# Patient Record
Sex: Male | Born: 1990 | Hispanic: Yes | Marital: Single | Smoking: Current some day smoker
Health system: Southern US, Community
[De-identification: ages and names within clinical notes are randomized; demographics above are authoritative.]

---
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
## 2011-06-29 ENCOUNTER — Emergency Department: Payer: Self-pay | Admitting: Emergency Medicine

## 2011-07-01 ENCOUNTER — Emergency Department: Payer: Self-pay | Admitting: Emergency Medicine

## 2017-09-03 ENCOUNTER — Ambulatory Visit
Admission: EM | Admit: 2017-09-03 | Discharge: 2017-09-03 | Disposition: A | Discharge status: Home / Self Care | Payer: 59 | Attending: Family Medicine | Admitting: Family Medicine

## 2017-09-03 ENCOUNTER — Encounter: Payer: Self-pay | Admitting: Emergency Medicine

## 2017-09-03 DIAGNOSIS — M7052 Other bursitis of knee, left knee: Secondary | ICD-10-CM | POA: Diagnosis not present

## 2017-09-03 MED ORDER — MELOXICAM 15 MG PO TABS: 15.0000 mg | ORAL_TABLET | Freq: Every day | ORAL | 0 refills | Status: DC | PRN

## 2017-09-03 NOTE — Discharge Instructions (Signed)
Rest, Ice, elevation.  Medication as directed.  Take care  Dr. Adriana Simasook

## 2017-09-03 NOTE — ED Provider Notes (Signed)
[facility]    CSN: [id number] Arrival date & time: 09/03/17  1049  History   Chief Complaint Chief Complaint  Patient presents with  . Knee Pain   HPI  27 year old male presents with left knee pain.  Patient reports that his left knee pain started on Wednesday.  He states that he works in [employer] and was recently under a house on his hands and knee.  Subsequently developed left anterior knee pain.  Pain is located below the patella.  Worse with pressure and activity.  No relieving factors.  Mild surrounding redness.  No medications or interventions tried.  Seems to be worsening.  No other associated symptoms.  No other complaints.  Social History Social History   Tobacco Use  . Smoking status: Current Some Day Smoker  . Smokeless tobacco: Never Used  Substance Use Topics  . Alcohol use: Yes    Comment: occassional   . Drug use: Not on file     Allergies   Patient has no known allergies.   Review of Systems Review of Systems  Constitutional: Negative.   Musculoskeletal:       Left knee pain, swelling.   Physical Exam Triage Vital Signs ED Triage Vitals  Enc Vitals Group     BP 09/03/17 1106 127/79     Pulse Rate 09/03/17 1106 (!) 51     Resp 09/03/17 1106 16     Temp 09/03/17 1106 98.3 F (36.8 C)     Temp src --      SpO2 09/03/17 1106 100 %     Weight 09/03/17 1107 170 lb (77.1 kg)     Height 09/03/17 1107 5\' 6"  (1.676 m)     Head Circumference --      Peak Flow --      Pain Score 09/03/17 1107 5     Pain Loc --      Pain Edu? --      Excl. in GC? --    Updated Vital Signs BP 127/79 (BP Location: Right Arm)   Pulse (!) 51   Temp 98.3 F (36.8 C)   Resp 16   Ht 5\' 6"  (1.676 m)   Wt 170 lb (77.1 kg)   SpO2 100%   BMI 27.44 kg/m   Visual Acuity Right Eye Distance:   Left Eye Distance:   Bilateral Distance:    Right Eye Near:   Left Eye Near:    Bilateral Near:     Physical Exam  Constitutional: He is oriented to person, place,  and time. He appears well-developed. No distress.  HENT:  Head: Normocephalic and atraumatic.  Pulmonary/Chest: Effort normal. No respiratory distress.  Musculoskeletal:  Left knee -patient with swelling in the infrapatellar region.  Mild erythema.  Slight warmth.  Tender anteriorly.  Ligaments intact.  Neurological: He is alert and oriented to person, place, and time.  Psychiatric: He has a normal mood and affect. His behavior is normal.  Nursing note and vitals reviewed.  UC Treatments / Results  Labs (all labs ordered are listed, but only abnormal results are displayed) Labs Reviewed - No data to display  EKG None  Radiology No results found.  Procedures Procedures (including critical care time)  Medications Ordered in UC Medications - No data to display  Initial Impression / Assessment and Plan / UC Course  I have reviewed the triage vital signs and the nursing notes.  Pertinent labs & imaging results that were available during my care of  the patient were reviewed by me and considered in my medical decision making (see chart for details).    27 year old male presents with infrapatellar bursitis.  Rest, ice, elevation.  Meloxicam as directed.  Final Clinical Impressions(s) / UC Diagnoses   Final diagnoses:  Infrapatellar bursitis of left knee     Discharge Instructions     Rest, Ice, elevation.  Medication as directed.  Take care  Dr. Adriana Simas   ED Prescriptions    Medication Sig Dispense Auth. Provider   meloxicam (MOBIC) 15 MG tablet Take 1 tablet (15 mg total) by mouth daily as needed. 30 tablet Tommie Sams, DO     Controlled Substance Prescriptions [place] Controlled Substance Registry consulted? Not Applicable   Tommie Sams, DO 09/03/17 1212

## 2017-09-03 NOTE — ED Triage Notes (Signed)
Pt reports left knee pain that started about 2 days ago after working requiring pt to crawl. Pt reports started as redness and swelling and reports has gotten larger

## 2017-09-14 ENCOUNTER — Emergency Department: Payer: 59

## 2017-09-14 ENCOUNTER — Other Ambulatory Visit: Payer: Self-pay

## 2017-09-14 ENCOUNTER — Emergency Department
Admission: EM | Admit: 2017-09-14 | Discharge: 2017-09-14 | Disposition: A | Discharge status: Home / Self Care | Payer: 59 | Attending: Emergency Medicine | Admitting: Emergency Medicine

## 2017-09-14 ENCOUNTER — Encounter: Payer: Self-pay | Admitting: Emergency Medicine

## 2017-09-14 DIAGNOSIS — F17210 Nicotine dependence, cigarettes, uncomplicated: Secondary | ICD-10-CM | POA: Diagnosis not present

## 2017-09-14 DIAGNOSIS — M7051 Other bursitis of knee, right knee: Secondary | ICD-10-CM | POA: Diagnosis not present

## 2017-09-14 DIAGNOSIS — M25561 Pain in right knee: Secondary | ICD-10-CM | POA: Diagnosis present

## 2017-09-14 DIAGNOSIS — Y939 Activity, unspecified: Secondary | ICD-10-CM | POA: Diagnosis not present

## 2017-09-14 MED ORDER — HYDROCODONE-ACETAMINOPHEN 5-325 MG PO TABS: 1.0000 | ORAL_TABLET | Freq: Four times a day (QID) | ORAL | 0 refills | Status: DC | PRN

## 2017-09-14 MED ORDER — PREDNISONE 10 MG PO TABS: ORAL_TABLET | ORAL | 0 refills | Status: DC

## 2017-09-14 NOTE — ED Triage Notes (Signed)
Patient complaining of right knee pain starting last PM.  Denies injury.  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  Redness and swelling noted right knee.

## 2017-09-14 NOTE — ED Provider Notes (Signed)
[facility] Emergency Department Provider Note  ____________________________________________   First MD Initiated Contact with Patient 09/14/17 1049     (approximate)  I have reviewed the triage vital signs and the nursing notes.   HISTORY  Chief Complaint Knee Pain   HPI Patrick Hogan is a 27 y.o. male is here with complaint of right knee pain that started last evening.  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  He states his right knee became red and swollen.  He denies any fever or chills.  There is been no nausea or vomiting.  He denies any previous injury to his right knee.  He states that something similar happened to his left knee and he was seen by his PCP.  He was prescribed meloxicam at that time but never got the prescription filled and his knee got better.  Currently rates his pain as an 8 out of 10.   History reviewed. No pertinent past medical history.  There are no active problems to display for this patient.   History reviewed. No pertinent surgical history.  Prior to Admission medications   Medication Sig Start Date End Date Taking? Authorizing Provider  HYDROcodone-acetaminophen (NORCO/VICODIN) 5-325 MG tablet Take 1 tablet by mouth every 6 (six) hours as needed for moderate pain. 09/14/17   Tommi RumpsSummers, Jaz Laningham L, PA-C  predniSONE (DELTASONE) 10 MG tablet Take 6 tablets  today, on day 2 take 5 tablets, day 3 take 4 tablets, day 4 take 3 tablets, day 5 take  2 tablets and 1 tablet the last day 09/14/17   Tommi RumpsSummers, Phillippe Orlick L, PA-C    Allergies Patient has no known allergies.  No family history on file.  Social History Social History   Tobacco Use  . Smoking status: Current Some Day Smoker  . Smokeless tobacco: Never Used  Substance Use Topics  . Alcohol use: Yes    Comment: occassional   . Drug use: Not on file    Review of Systems Constitutional: No fever/chills Cardiovascular:  Denies chest pain. Respiratory: Denies shortness of breath. Gastrointestinal:  No nausea, no vomiting. Musculoskeletal: Positive for right knee pain. Skin: Positive for erythema right knee. Neurological: Negative for headaches, focal weakness or numbness. ___________________________________________   PHYSICAL EXAM:  VITAL SIGNS: ED Triage Vitals  Enc Vitals Group     BP 09/14/17 1046 127/73     Pulse Rate 09/14/17 1046 64     Resp 09/14/17 1046 16     Temp 09/14/17 1046 98.4 F (36.9 C)     Temp Source 09/14/17 1046 Oral     SpO2 09/14/17 1046 100 %     Weight 09/14/17 1047 170 lb (77.1 kg)     Height 09/14/17 1047 5\' 7"  (1.702 m)     Head Circumference --      Peak Flow --      Pain Score 09/14/17 1046 8     Pain Loc --      Pain Edu? --      Excl. in GC? --    Constitutional: Alert and oriented. Well appearing and in no acute distress. Eyes: Conjunctivae are normal.  Head: Atraumatic. Neck: No stridor.   Cardiovascular: Normal rate, regular rhythm. Grossly normal heart sounds.  Good peripheral circulation. Respiratory: Normal respiratory effort.  No retractions. Lungs CTAB. Musculoskeletal: Examination of the right knee there is moderate soft tissue swelling at the patella without effusion.  Range of motion is  restricted secondary to pain.  There is marked tenderness on palpation.  Skin is intact and no evidence of foreign body is present.  There is moderate amount of tenderness also on the infra patella tendon area. Neurologic:  Normal speech and language. No gross focal neurologic deficits are appreciated.  Skin:  Skin is warm, dry and intact.  Erythema right knee as noted above. Psychiatric: Mood and affect are normal. Speech and behavior are normal.  ____________________________________________   LABS (all labs ordered are listed, but only abnormal results are displayed)  Labs Reviewed - No data to display  RADIOLOGY  ED MD interpretation:   Right knee x-ray  shows soft tissue swelling.  Official radiology report(s): Dg Knee Complete 4 Views Right  Result Date: 09/14/2017 CLINICAL DATA:  Right knee pain. EXAM: RIGHT KNEE - COMPLETE 4+ VIEW COMPARISON:  No prior. FINDINGS: No acute bony or joint abnormality. No evidence of fracture or dislocation. Prepatellar soft tissue swelling noted. IMPRESSION: 1.  Prepatellar soft tissue swelling. 2.  No acute bony abnormality. Electronically Signed   By: Maisie Fus  Register   On: 09/14/2017 11:54    ____________________________________________   PROCEDURES  Procedure(s) performed: None  Procedures  Critical Care performed: No  ____________________________________________   INITIAL IMPRESSION / ASSESSMENT AND PLAN / ED COURSE  As part of my medical decision making, I reviewed the following data within the electronic MEDICAL RECORD NUMBER Notes from prior ED visits and [place] Controlled Substance Database  Patient was made aware of x-ray results.  Patient was placed on prednisone tapering dose starting at 60 mg and also a prescription for Norco as needed for pain.  Patient was given a note to remain out of work for 2 days.  He is encouraged to elevate his knee as needed for swelling.  He is to follow-up with his PCP or the orthopedic department at [facility] if any continued problems.  ____________________________________________   FINAL CLINICAL IMPRESSION(S) / ED DIAGNOSES  Final diagnoses:  Infrapatellar bursitis of right knee     ED Discharge Orders        Ordered    HYDROcodone-acetaminophen (NORCO/VICODIN) 5-325 MG tablet  Every 6 hours PRN     09/14/17 1208    predniSONE (DELTASONE) 10 MG tablet     09/14/17 1208       Note:  This document was prepared using Dragon voice recognition software and may include unintentional dictation errors.    Tommi Rumps, PA-C 09/14/17 1720    Emily Filbert, MD 09/15/17 [phone]

## 2017-09-14 NOTE — Discharge Instructions (Addendum)
Follow-up with Dr. Allena KatzPatel who is on-call for orthopedics if not improving.  Begin taking prednisone today as directed and for the next 5 days.  Norco is for pain every 6 hours.  Do not drive or operate machinery while taking this medication.  You may ice and elevate your knee as needed for comfort.

## 2017-09-14 NOTE — ED Notes (Signed)
See triage note  Presents with pain and swelling to right knee  States sx's started last pm   Denies any injury  Unsure if he was stung by something  Knee is red and swollen on arrival

## 2018-09-16 ENCOUNTER — Ambulatory Visit: Admission: EM | Admit: 2018-09-16 | Discharge: 2018-09-16 | Discharge status: Against Medical Advice | Payer: 59

## 2018-09-16 ENCOUNTER — Telehealth: Payer: 59 | Admitting: Family

## 2018-09-16 ENCOUNTER — Other Ambulatory Visit: Payer: Self-pay

## 2018-09-16 DIAGNOSIS — Z7189 Other specified counseling: Secondary | ICD-10-CM

## 2018-09-16 NOTE — Progress Notes (Signed)
E-Visit for Corona Virus Screening   Your current symptoms could be consistent with the coronavirus.  Many health care providers can now test patients at their office but not all are.  [place] has multiple testing sites. For information on our COVID testing locations and hours go to [email or web address]  Please quarantine yourself while awaiting your test results.    COVID-19 is a respiratory illness with symptoms that are similar to the flu. Symptoms are typically mild to moderate, but there have been cases of severe illness and death due to the virus. The following symptoms may appear 2-14 days after exposure: . Fever . Cough . Shortness of breath or difficulty breathing . Chills . Repeated shaking with chills . Muscle pain . Headache . Sore throat . New loss of taste or smell . Fatigue . Congestion or runny nose . Nausea or vomiting . Diarrhea  It is vitally important that if you feel that you have an infection such as this virus or any other virus that you stay home and away from places where you may spread it to others.  You should self-quarantine for 14 days if you have symptoms that could potentially be coronavirus or have been in close contact a with a person diagnosed with COVID-19 within the last 2 weeks. You should avoid contact with people age 27 and older.   You should wear a mask or cloth face covering over your nose and mouth if you must be around other people or animals, including pets (even at home). Try to stay at least 6 feet away from other people. This will protect the people around you.  You may also take acetaminophen (Tylenol) as needed for fever.   Reduce your risk of any infection by using the same precautions used for avoiding the common cold or flu:  Marland Kitchen. Wash your hands often with soap and warm water for at least 20 seconds.  If soap and water are not readily available, use an alcohol-based hand sanitizer with at least 60%  alcohol.  . If coughing or sneezing, cover your mouth and nose by coughing or sneezing into the elbow areas of your shirt or coat, into a tissue or into your sleeve (not your hands). . Avoid shaking hands with others and consider head nods or verbal greetings only. . Avoid touching your eyes, nose, or mouth with unwashed hands.  . Avoid close contact with people who are sick. . Avoid places or events with large numbers of people in one location, like concerts or sporting events. . Carefully consider travel plans you have or are making. . If you are planning any travel outside or inside the [place], visit the CDC's Travelers' Health webpage for the latest health notices. . If you have some symptoms but not all symptoms, continue to monitor at home and seek medical attention if your symptoms worsen. . If you are having a medical emergency, call 911.  HOME CARE . Only take medications as instructed by your medical team. . Drink plenty of fluids and get plenty of rest. . A steam or ultrasonic humidifier can help if you have congestion.   GET HELP RIGHT AWAY IF YOU HAVE EMERGENCY WARNING SIGNS** FOR COVID-19. If you or someone is showing any of these signs seek emergency medical care immediately. Call 911 or proceed to your closest emergency facility if: . You develop worsening high fever. . Trouble breathing . Bluish lips or face . Persistent pain or pressure in the chest . New confusion .  Inability to wake or stay awake . You cough up blood. . Your symptoms become more severe  **This list is not all possible symptoms. Contact your medical provider for any symptoms that are sever or concerning to you.   MAKE SURE YOU   Understand these instructions.  Will watch your condition.  Will get help right away if you are not doing well or get worse.  Your e-visit answers were reviewed by a board certified advanced clinical practitioner to complete your personal care plan.  Depending on the  condition, your plan could have included both over the counter or prescription medications.  If there is a problem please reply once you have received a response from your provider.  Your safety is important to [place].  If you have drug allergies check your prescription carefully.    You can use MyChart to ask questions about today's visit, request a non-urgent call back, or ask for a work or school excuse for 24 hours related to this e-Visit. If it has been greater than 24 hours you will need to follow up with your provider, or enter a new e-Visit to address those concerns. You will get an e-mail in the next two days asking about your experience.  I hope that your e-visit has been valuable and will speed your recovery. Thank you for using e-visits.   Greater than 5 minutes, yet less than 10 minutes of time have been spent researching, coordinating, and implementing care for this patient today.  Thank you for the details you included in the comment boxes. Those details are very helpful in determining the best course of treatment for you and help [place] to provide the best care.

## 2019-08-07 IMAGING — DX DG KNEE COMPLETE 4+V*R*
4 series · 4 of 4 positions shown · non-contrast
Comparison: No prior.

CLINICAL DATA: Right knee pain.

EXAM:
RIGHT KNEE - COMPLETE 4+ VIEW

[knee ap]
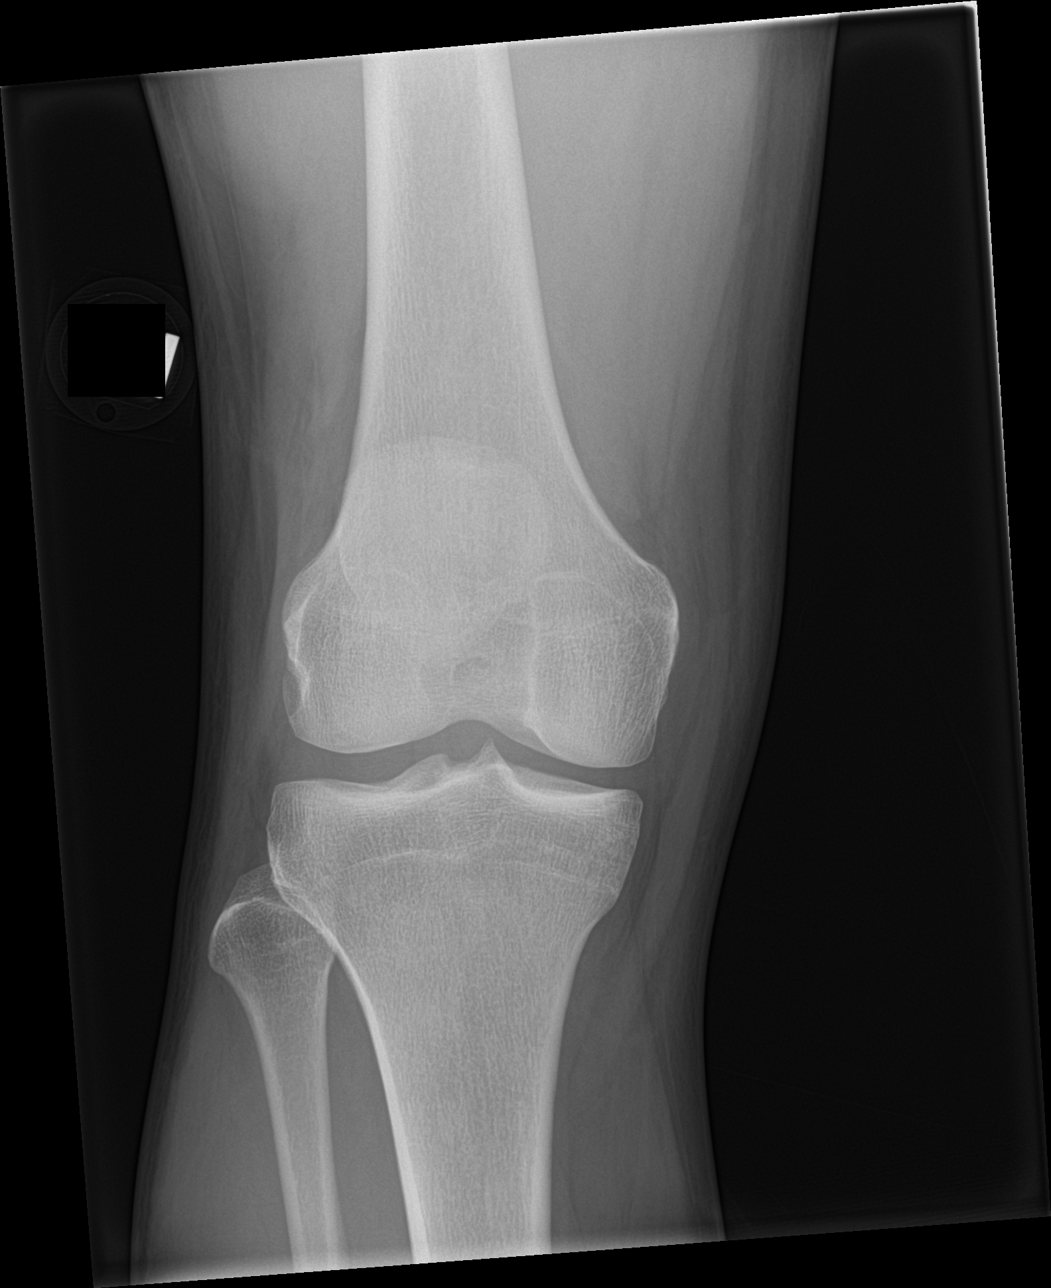

[knee lat]
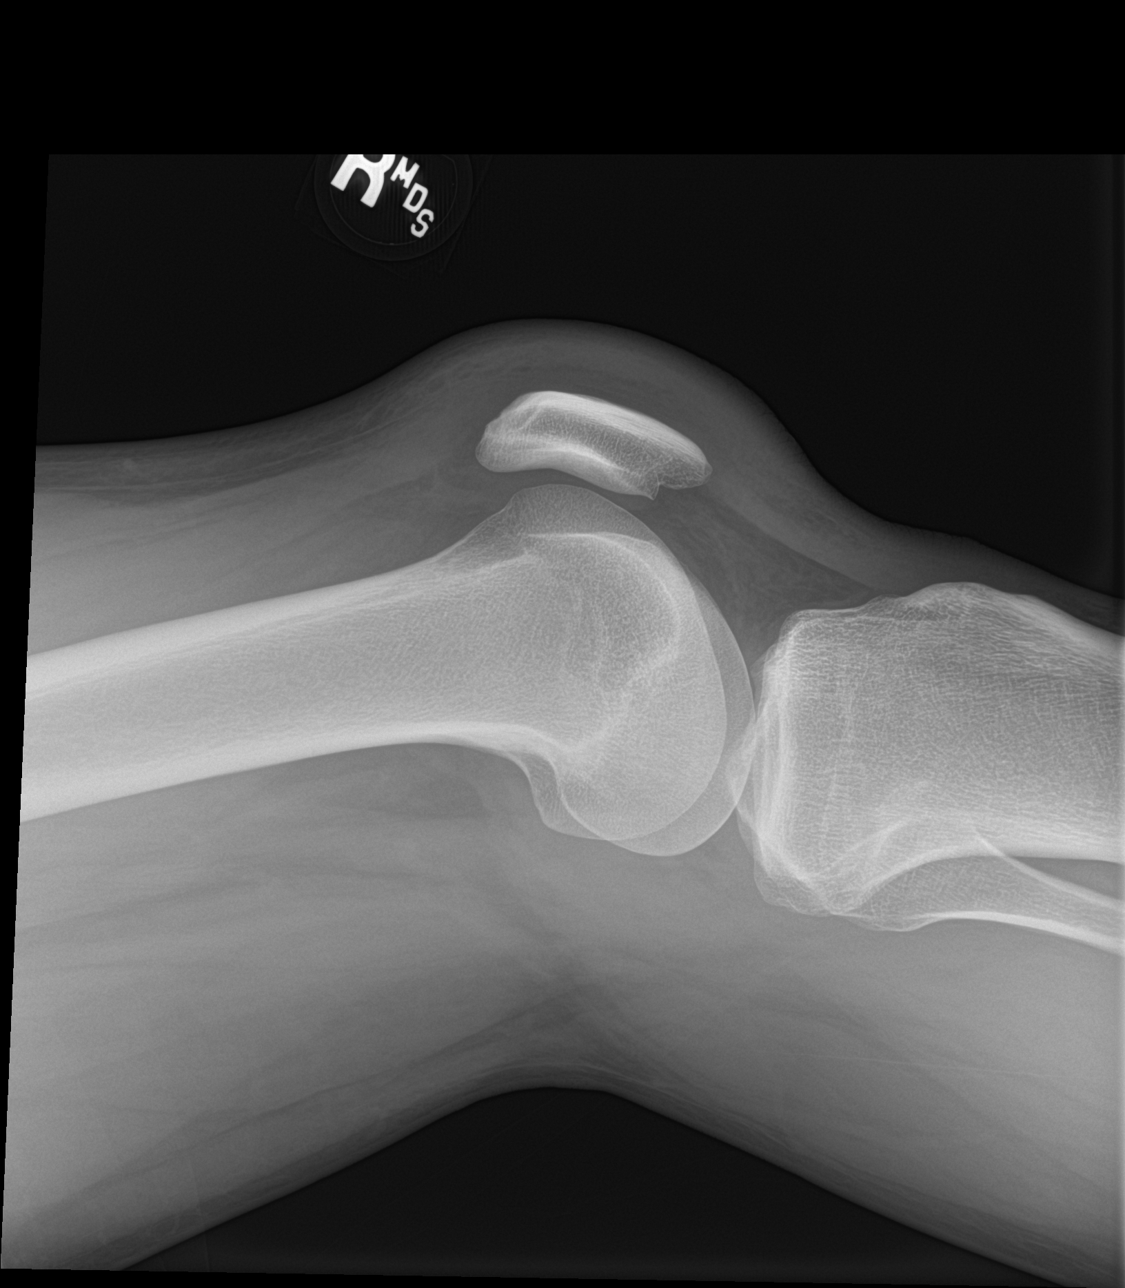

[knee obl (1 of 2)]
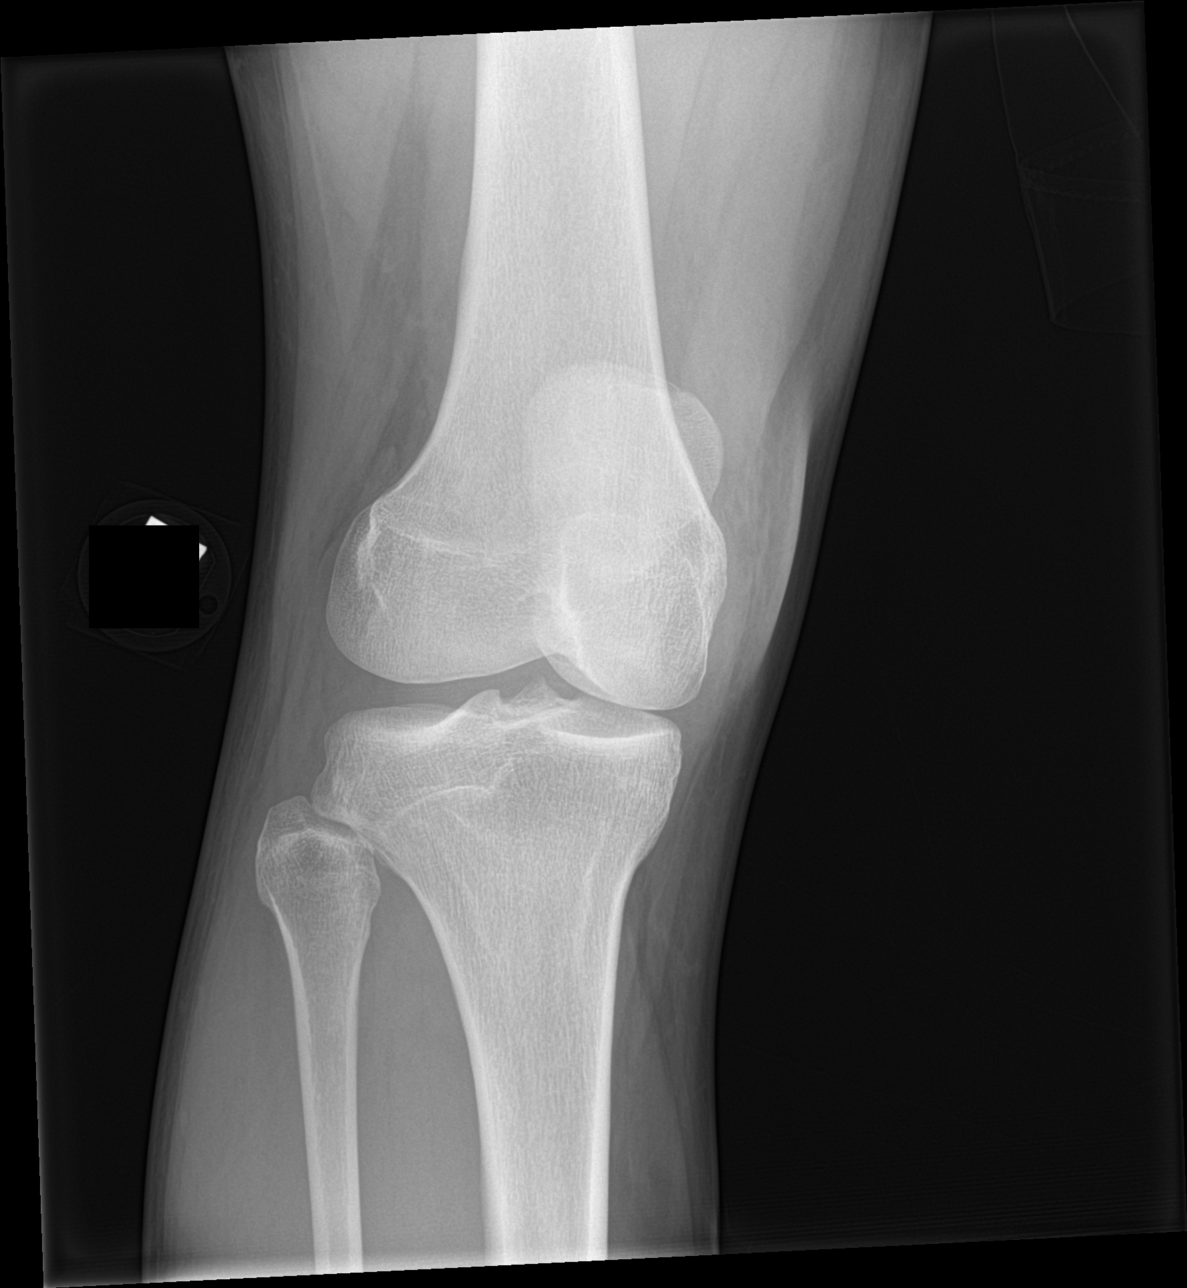

[knee obl (2 of 2)]
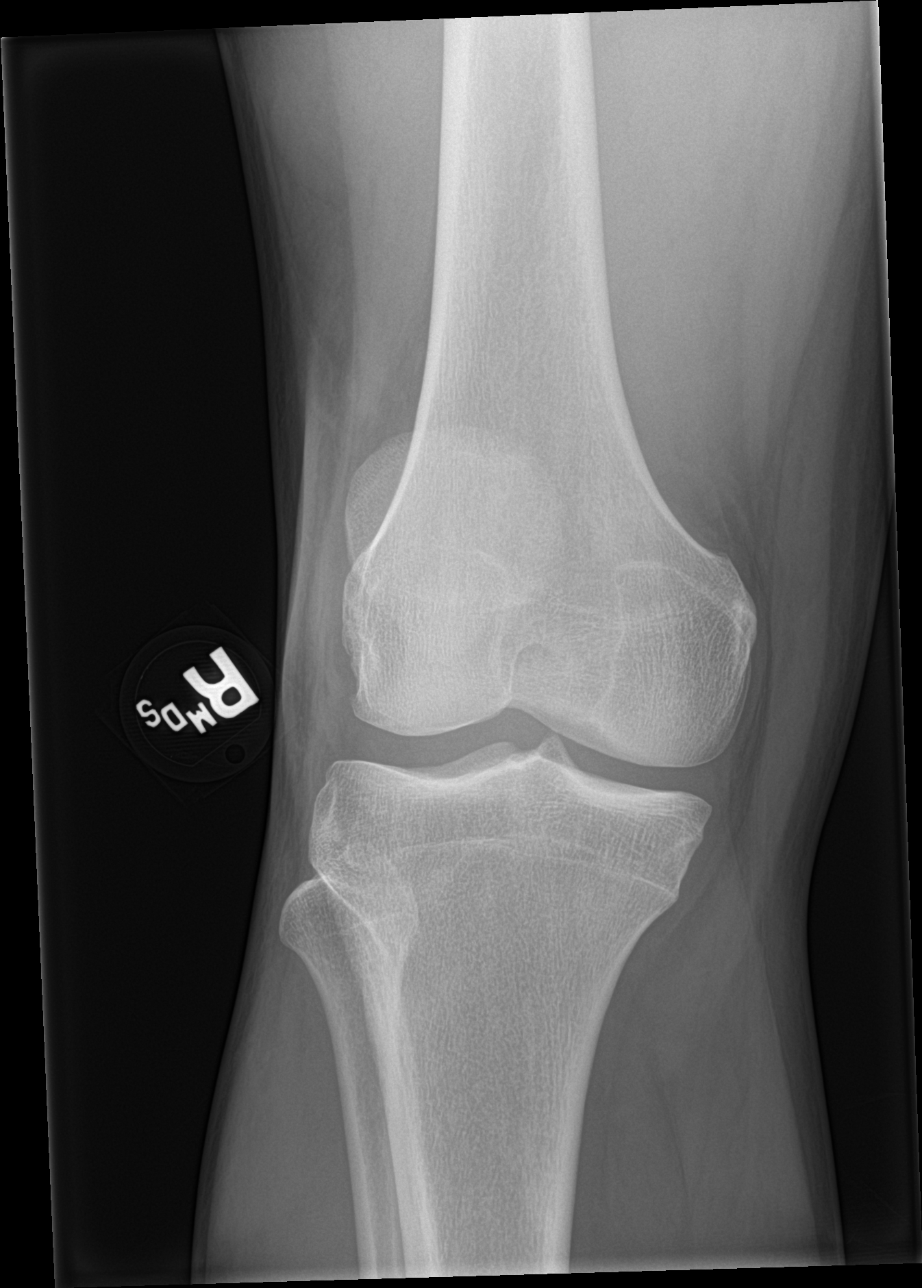

[4 of 4 positions shown; findings below may reference images not displayed]

FINDINGS: No acute bony or joint abnormality. No evidence of fracture or
dislocation. Prepatellar soft tissue swelling noted.
IMPRESSION: 1.  Prepatellar soft tissue swelling.

2.  No acute bony abnormality.

## 2019-09-13 ENCOUNTER — Ambulatory Visit
Admission: EM | Admit: 2019-09-13 | Discharge: 2019-09-13 | Disposition: A | Discharge status: Home / Self Care | Payer: 59 | Attending: Family Medicine | Admitting: Family Medicine

## 2019-09-13 ENCOUNTER — Other Ambulatory Visit: Payer: Self-pay

## 2019-09-13 ENCOUNTER — Encounter: Payer: Self-pay | Admitting: Emergency Medicine

## 2019-09-13 DIAGNOSIS — Z23 Encounter for immunization: Secondary | ICD-10-CM | POA: Diagnosis not present

## 2019-09-13 DIAGNOSIS — S61210A Laceration without foreign body of right index finger without damage to nail, initial encounter: Secondary | ICD-10-CM

## 2019-09-13 MED ORDER — TETANUS-DIPHTH-ACELL PERTUSSIS 5-2.5-18.5 LF-MCG/0.5 IM SUSP
0.5000 mL | Freq: Once | INTRAMUSCULAR | Status: AC
  Administered 2019-09-13: 0.5 mL via INTRAMUSCULAR

## 2019-09-13 MED ORDER — TETANUS-DIPHTHERIA TOXOIDS TD 5-2 LFU IM INJ: 0.5000 mL | INJECTION | Freq: Once | INTRAMUSCULAR | Status: DC

## 2019-09-13 NOTE — ED Triage Notes (Signed)
Patient cut his right pointer finger on a piece of metal today.

## 2019-09-13 NOTE — ED Provider Notes (Signed)
[facility]    CSN: [id number] Arrival date & time: 09/13/19  1749      History   Chief Complaint Chief Complaint  Patient presents with  . Laceration    HPI Patrick Hogan is a 29 y.o. male.   29 yo male with a c/o right index finger laceration about 2 hours when he cut his finger with a piece of metal. Does not recall his last tetanus shot.    Laceration   History reviewed. No pertinent past medical history.  There are no problems to display for this patient.   History reviewed. No pertinent surgical history.     Home Medications    Prior to Admission medications   Medication Sig Start Date End Date Taking? Authorizing Provider  HYDROcodone-acetaminophen (NORCO/VICODIN) 5-325 MG tablet Take 1 tablet by mouth every 6 (six) hours as needed for moderate pain. 09/14/17   Tommi Rumps, PA-C  predniSONE (DELTASONE) 10 MG tablet Take 6 tablets  today, on day 2 take 5 tablets, day 3 take 4 tablets, day 4 take 3 tablets, day 5 take  2 tablets and 1 tablet the last day 09/14/17   Tommi Rumps, PA-C    Family History History reviewed. No pertinent family history.  Social History Social History   Tobacco Use  . Smoking status: Current Some Day Smoker  . Smokeless tobacco: Never Used  Substance Use Topics  . Alcohol use: Yes    Comment: occassional   . Drug use: Not on file     Allergies   Patient has no known allergies.   Review of Systems Review of Systems   Physical Exam Triage Vital Signs ED Triage Vitals  Enc Vitals Group     BP 09/13/19 1813 130/76     Pulse Rate 09/13/19 1813 68     Resp 09/13/19 1813 18     Temp 09/13/19 1813 97.6 F (36.4 C)     Temp Source 09/13/19 1813 Oral     SpO2 09/13/19 1813 100 %     Weight 09/13/19 1812 169 lb 15.6 oz (77.1 kg)     Height 09/13/19 1812 5\' 7"  (1.702 m)     Head Circumference --      Peak Flow --      Pain Score 09/13/19 1812 0     Pain Loc --      Pain Edu? --      Excl. in  GC? --    No data found.  Updated Vital Signs BP 130/76 (BP Location: Right Arm)   Pulse 68   Temp 97.6 F (36.4 C) (Oral)   Resp 18   Ht 5\' 7"  (1.702 m)   Wt 77.1 kg   SpO2 100%   BMI 26.62 kg/m   Visual Acuity Right Eye Distance:   Left Eye Distance:   Bilateral Distance:    Right Eye Near:   Left Eye Near:    Bilateral Near:     Physical Exam Vitals and nursing note reviewed.  Constitutional:      General: He is not in acute distress.    Appearance: He is not toxic-appearing or diaphoretic.  Musculoskeletal:     Right hand: Laceration (superficial to mid-distal index finger) present. No swelling, deformity or bony tenderness. Normal range of motion. Normal strength. Normal sensation. There is no disruption of two-point discrimination. Normal capillary refill. Normal pulse.     Comments: Right hand neurovascularly intact  Neurological:     Mental  Status: He is alert.      UC Treatments / Results  Labs (all labs ordered are listed, but only abnormal results are displayed) Labs Reviewed - No data to display  EKG   Radiology No results found.  Procedures Laceration Repair  Date/Time: 09/13/2019 7:12 PM Performed by: Payton Mccallum, MD Authorized by: Payton Mccallum, MD   Consent:    Consent obtained:  Verbal   Consent given by:  Patient   Risks discussed:  Infection, need for additional repair, poor wound healing, poor cosmetic result, pain, retained foreign body, tendon damage, vascular damage and nerve damage   Alternatives discussed:  No treatment Anesthesia (see MAR for exact dosages):    Anesthesia method:  None Laceration details:    Location:  Finger   Finger location:  R index finger   Length (cm):  2 Repair type:    Repair type:  Simple Pre-procedure details:    Preparation:  Patient was prepped and draped in usual sterile fashion Exploration:    Wound exploration: wound explored through full range of motion     Wound extent: no areolar  tissue violation noted, no fascia violation noted, no foreign bodies/material noted, no muscle damage noted, no nerve damage noted, no tendon damage noted, no underlying fracture noted and no vascular damage noted     Wound extent comment:  Epidermal, superficial   Contaminated: no   Treatment:    Area cleansed with:  Hibiclens   Amount of cleaning:  Standard Skin repair:    Repair method:  Steri-Strips   Number of Steri-Strips:  3 Approximation:    Approximation:  Close Post-procedure details:    Dressing:  Non-adherent dressing and splint for protection   Patient tolerance of procedure:  Tolerated well, no immediate complications   (including critical care time)  Medications Ordered in UC Medications  Tdap (BOOSTRIX) injection 0.5 mL (0.5 mLs Intramuscular Given 09/13/19 1824)    Initial Impression / Assessment and Plan / UC Course  I have reviewed the triage vital signs and the nursing notes.  Pertinent labs & imaging results that were available during my care of the patient were reviewed by me and considered in my medical decision making (see chart for details).      Final Clinical Impressions(s) / UC Diagnoses   Final diagnoses:  Laceration of right index finger without foreign body without damage to nail, initial encounter    ED Prescriptions    None      1. diagnosis reviewed with patient 2. Procedure as per note above 3. Routine wound care 4. Given Tdap 5. Follow-up prn  PDMP not reviewed this encounter.   Payton Mccallum, MD 09/13/19 1919

## 2020-09-02 ENCOUNTER — Other Ambulatory Visit: Payer: Self-pay

## 2020-09-02 ENCOUNTER — Ambulatory Visit
Admission: EM | Admit: 2020-09-02 | Discharge: 2020-09-02 | Disposition: A | Discharge status: Home / Self Care | Payer: Managed Care, Other (non HMO) | Attending: Emergency Medicine | Admitting: Emergency Medicine

## 2020-09-02 ENCOUNTER — Encounter: Payer: Self-pay | Admitting: Emergency Medicine

## 2020-09-02 DIAGNOSIS — L03317 Cellulitis of buttock: Secondary | ICD-10-CM | POA: Insufficient documentation

## 2020-09-02 MED ORDER — DOXYCYCLINE HYCLATE 100 MG PO CAPS: 100.0000 mg | ORAL_CAPSULE | Freq: Two times a day (BID) | ORAL | 0 refills | Status: AC

## 2020-09-02 NOTE — ED Provider Notes (Signed)
[facility]    CSN: [id number] Arrival date & time: 09/02/20  0806      History   Chief Complaint Chief Complaint  Patient presents with   Abscess    HPI Patrick Hogan is a 30 y.o. male.   HPI  Old male here for evaluation of abscess.  Patient reports that he was bitten by a tick in several places on his left leg, right groin, and he believes his right buttock.  She states that he had an area of swelling and redness on his right buttock that was the size of a baseball.  He was applying warm compresses 3 times a day and it has gone down in size but it continues to be hard and red.  He states that it has started to drain a thick yellow-red discharge.  He had a subjective fever and chills earlier in the week but not at present.  History reviewed. No pertinent past medical history.  There are no problems to display for this patient.   History reviewed. No pertinent surgical history.     Home Medications    Prior to Admission medications   Medication Sig Start Date End Date Taking? Authorizing Provider  doxycycline (VIBRAMYCIN) 100 MG capsule Take 1 capsule (100 mg total) by mouth 2 (two) times daily. 09/02/20  Yes Becky Augusta, NP    Family History History reviewed. No pertinent family history.  Social History Social History   Tobacco Use   Smoking status: Some Days    Pack years: 0.00   Smokeless tobacco: Never  Substance Use Topics   Alcohol use: Yes    Comment: occassional      Allergies   Patient has no known allergies.   Review of Systems Review of Systems  Constitutional:  Positive for chills and fever.  Skin:  Positive for color change and wound.    Physical Exam Triage Vital Signs ED Triage Vitals  Enc Vitals Group     BP      Pulse      Resp      Temp      Temp src      SpO2      Weight      Height      Head Circumference      Peak Flow      Pain Score      Pain Loc      Pain Edu?      Excl. in GC?    No data  found.  Updated Vital Signs BP (!) 145/76 (BP Location: Left Arm)   Pulse (!) 58   Temp 98.5 F (36.9 C) (Oral)   Resp 16   SpO2 100%   Visual Acuity Right Eye Distance:   Left Eye Distance:   Bilateral Distance:    Right Eye Near:   Left Eye Near:    Bilateral Near:     Physical Exam Vitals and nursing note reviewed.  Constitutional:      General: He is not in acute distress.    Appearance: Normal appearance. He is not ill-appearing.  HENT:     Head: Normocephalic and atraumatic.  Skin:    General: Skin is warm and dry.     Capillary Refill: Capillary refill takes less than 2 seconds.     Findings: Erythema and lesion present.  Neurological:     General: No focal deficit present.     Mental Status: He is alert and oriented to person,  place, and time.  Psychiatric:        Mood and Affect: Mood normal.        Behavior: Behavior normal.        Thought Content: Thought content normal.        Judgment: Judgment normal.     UC Treatments / Results  Labs (all labs ordered are listed, but only abnormal results are displayed) Labs Reviewed  AEROBIC CULTURE W GRAM STAIN (SUPERFICIAL SPECIMEN)    EKG   Radiology No results found.  Procedures Procedures (including critical care time)  Medications Ordered in UC Medications - No data to display  Initial Impression / Assessment and Plan / UC Course  I have reviewed the triage vital signs and the nursing notes.  Pertinent labs & imaging results that were available during my care of the patient were reviewed by me and considered in my medical decision making (see chart for details).  Patient is a very pleasant and nontoxic-appearing 30 year old male here for evaluation of abscess on his right buttock as outlined in the HPI above.  Patient's physical exam reveals a large area of erythema and induration in the central right buttock with a central area of drainage.  There is no fluctuance but there is a scant amount of  brown pus draining from the central opening.  Sample collected for culture.  The site is approximately the size of a teacup saucer.  We will treat patient for cellulitis with doxycycline twice daily for 10 days and have him continue to apply warm compresses to facilitate drainage.  The patient was instructed to return if he develops any soft, spongy area within the area of existing redness as an abscess may be forming and at that point we can perform an incision and drainage.  Patient verbalizes understanding of same.  Patient denies need for work note.   Final Clinical Impressions(s) / UC Diagnoses   Final diagnoses:  Cellulitis of buttock, right     Discharge Instructions      Take the Doxycycline twice daily with food for 10 days.  Doxycycline will make you more sensitive to sunburn so wear sunscreen when outdoors and reapply it every 90 minutes.  Apply warm compresses to help promote drainage.  Use OTC Tylenol and Ibuprofen according to the package instructions as needed for pain.  Return for new or worsening symptoms.      ED Prescriptions     Medication Sig Dispense Auth. Provider   doxycycline (VIBRAMYCIN) 100 MG capsule Take 1 capsule (100 mg total) by mouth 2 (two) times daily. 20 capsule Becky Augusta, NP      PDMP not reviewed this encounter.   Becky Augusta, NP 09/02/20 [phone]

## 2020-09-02 NOTE — ED Triage Notes (Signed)
Pt presents today with c/o of possible abscess to right buttocks x 1 week. He reports it has "very hard" with some draining last evening. He also reports having several tick bites to left upper leg and groin.

## 2020-09-02 NOTE — Discharge Instructions (Addendum)
Take the Doxycycline twice daily with food for 10 days.  Doxycycline will make you more sensitive to sunburn so wear sunscreen when outdoors and reapply it every 90 minutes.  Apply warm compresses to help promote drainage.  Use OTC Tylenol and Ibuprofen according to the package instructions as needed for pain.  Return for new or worsening symptoms.   

## 2020-09-05 ENCOUNTER — Other Ambulatory Visit: Payer: Self-pay

## 2020-09-05 ENCOUNTER — Ambulatory Visit
Admission: EM | Admit: 2020-09-05 | Discharge: 2020-09-05 | Disposition: A | Discharge status: Home / Self Care | Payer: Managed Care, Other (non HMO) | Attending: Family | Admitting: Family

## 2020-09-05 ENCOUNTER — Encounter: Payer: Self-pay | Admitting: Emergency Medicine

## 2020-09-05 DIAGNOSIS — Z5189 Encounter for other specified aftercare: Secondary | ICD-10-CM | POA: Diagnosis not present

## 2020-09-05 DIAGNOSIS — L0231 Cutaneous abscess of buttock: Secondary | ICD-10-CM

## 2020-09-05 LAB — AEROBIC CULTURE W GRAM STAIN (SUPERFICIAL SPECIMEN): Special Requests: NORMAL

## 2020-09-05 NOTE — ED Provider Notes (Signed)
[facility]    CSN: [id number] Arrival date & time: 09/05/20  0806      History   Chief Complaint Chief Complaint  Patient presents with   Abscess    HPI Patrick Hogan is a 30 y.o. male.   30 year old male presents for recheck of right buttock abscess/cellulitis. He was seen here at Urgent Care 3 days ago with significant redness and swelling around an area of induration on his right buttock. Was firm with minimal drainage. Wound culture obtained which is positive for Staph aureus but pending sensitivities. Placed on Doxycycline and told to use warm compresses to area to help with drainage. Today, area is draining some fluid but he feels that infection/pus is "still stuck" underneath skin and continues to be quite painful. Tolerating Doxycyline with no side effects. Has been taking Ibuprofen with some relief. 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 No other chronic health issues. Takes no other daily medication.   The history is provided by the patient.   History reviewed. No pertinent past medical history.  There are no problems to display for this patient.   History reviewed. No pertinent surgical history.     Home Medications    Prior to Admission medications   Medication Sig Start Date End Date Taking? Authorizing Provider  doxycycline (VIBRAMYCIN) 100 MG capsule Take 1 capsule (100 mg total) by mouth 2 (two) times daily. 09/02/20  Yes Becky Augusta, NP    Family History No family history on file.  Social History Social History   Tobacco Use   Smoking status: Some Days    Pack years: 0.00   Smokeless tobacco: Never  Substance Use Topics   Alcohol use: Yes    Comment: occassional      Allergies   Patient has no known allergies.   Review of Systems Review of Systems  Constitutional:  Negative for appetite change, chills, fatigue and fever.  Respiratory:  Negative for chest tightness and shortness of breath.    Gastrointestinal:  Negative for diarrhea, nausea and vomiting.  Musculoskeletal:  Negative for arthralgias and myalgias.  Skin:  Positive for color change and wound.  Allergic/Immunologic: Negative for environmental allergies, food allergies and immunocompromised state.  Neurological:  Negative for dizziness, tremors, seizures, syncope, weakness, light-headedness and numbness.  Hematological:  Negative for adenopathy. Does not bruise/bleed easily.  Psychiatric/Behavioral: Negative.      Physical Exam Triage Vital Signs ED Triage Vitals  Enc Vitals Group     BP 09/05/20 0820 128/84     Pulse Rate 09/05/20 0818 (!) 56     Resp 09/05/20 0818 16     Temp 09/05/20 0818 98.7 F (37.1 C)     Temp Source 09/05/20 0818 Oral     SpO2 09/05/20 0818 100 %     Weight --      Height --      Head Circumference --      Peak Flow --      Pain Score 09/05/20 0817 6     Pain Loc --      Pain Edu? --      Excl. in GC? --    No data found.  Updated Vital Signs BP 128/84   Pulse (!) 56   Temp 98.7 F (37.1 C) (Oral)   Resp 16   SpO2 100%   Visual Acuity Right Eye Distance:   Left Eye Distance:   Bilateral Distance:  Right Eye Near:   Left Eye Near:    Bilateral Near:     Physical Exam Vitals and nursing note reviewed.  Constitutional:      General: He is awake. He is not in acute distress.    Appearance: He is well-developed and well-groomed.     Comments: He is sitting on the exam chair with right side slightly elevated due to pain and discomfort of abscess. He is in no acute distress.   HENT:     Head: Normocephalic and atraumatic.     Right Ear: Hearing normal.     Left Ear: Hearing normal.  Eyes:     Extraocular Movements: Extraocular movements intact.     Conjunctiva/sclera: Conjunctivae normal.  Cardiovascular:     Rate and Rhythm: Normal rate.  Pulmonary:     Effort: Pulmonary effort is normal.  Musculoskeletal:     Cervical back: Normal range of motion.        Legs:  Skin:    General: Skin is warm.     Capillary Refill: Capillary refill takes less than 2 seconds.     Findings: Abscess, erythema and wound present. No bruising or ecchymosis.     Comments: 3cm round abscess present on right mid to outer buttock with about 1cm surrounding erythema. Previous area of erythema was about 6cm. Wound present in center with opening of about 41mm and depth of 50mm. Very tender with yellowish drainage present. Some serosanguinous fluid present as well. Skin near opening of abscess trying to form fibers to heal. Still firm around central abscess area but surrounding area is now soft and less tender.   Neurological:     General: No focal deficit present.     Mental Status: He is alert and oriented to person, place, and time.     Sensory: Sensation is intact. No sensory deficit.     Motor: Motor function is intact.  Psychiatric:        Mood and Affect: Mood normal.        Behavior: Behavior normal. Behavior is cooperative.        Thought Content: Thought content normal.        Judgment: Judgment normal.     UC Treatments / Results  Labs (all labs ordered are listed, but only abnormal results are displayed) Labs Reviewed - No data to display  EKG   Radiology No results found.  Procedures Incision and Drainage  Date/Time: 09/05/2020 9:05 AM Performed by: Sudie Grumbling, NP Authorized by: Sudie Grumbling, NP   Consent:    Consent obtained:  Verbal   Consent given by:  Patient   Risks, benefits, and alternatives were discussed: yes     Risks discussed:  Bleeding, incomplete drainage and pain   Alternatives discussed:  Observation and alternative treatment Universal protocol:    Procedure explained and questions answered to patient or proxy's satisfaction: yes     Test results available : yes (wound culture shows Staph aureus but pending sensitivities)     Imaging studies available: no     Patient identity confirmed:  Verbally with patient and  arm band Location:    Type:  Abscess   Size:  1cm with 85mm opening   Location:  Lower extremity   Lower extremity location:  Buttock   Buttock location:  R buttock Pre-procedure details:    Skin preparation:  Povidone-iodine Sedation:    Sedation type:  None Anesthesia:    Anesthesia method:  None Procedure  type:    Complexity:  Simple Procedure details:    Ultrasound guidance: no     Needle aspiration: no     Incision types:  Stab incision (with 11in blade scapel)   Incision depth:  Dermal (slightly increased opening of abscess/wound to 71mm)   Techniques: debrided briefly with cotton tip swab.   Drainage:  Bloody and serosanguinous   Drainage amount:  Scant   Wound treatment:  Wound left open   Packing materials:  1/2 in iodoform gauze   Amount 1/2" iodoform:  3cm Post-procedure details:    Procedure completion:  Tolerated well, no immediate complications Comments:     Berneta Sages, NP also examined patient since he had seen him 3 days ago at Urgent Care. Indicated that area of erythema was much improved but wound was deeper and agreed with packing wound to help with drainage and healing and avoid trapping infection. Berneta Sages will see patient back in 2 days for packing removal and re-evaluation.  (including critical care time)  Medications Ordered in UC Medications - No data to display  Initial Impression / Assessment and Plan / UC Course  I have reviewed the triage vital signs and the nursing notes.  Pertinent labs & imaging results that were available during my care of the patient were reviewed by me and considered in my medical decision making (see chart for details).     Discussed with patient that we opened wound slightly to help increase drainage. Packed with Iodoform gauze and covered with non-adherent gauze and paper tape. Discussed that he should continue Doxycycline for now but may need to add or change antibiotic pending final wound culture results. Continue to  apply warm compresses to area 3 times a day to help with drainage and comfort. Leave packing in place but can clean around wound. Continue Ibuprofen 600mg  every 8 hours as needed for pain and swelling. Note written by RN for work. Follow-up here in 2 days for removal of packing, recheck and possible repacking of wound.  Final Clinical Impressions(s) / UC Diagnoses   Final diagnoses:  Abscess of buttock, right  Encounter for wound care     Discharge Instructions      Continue to take Doxycycline twice a day as directed. Continue to apply warm compresses to area 3 times a day to help with drainage. Leave packing in place but can clean around wound. Continue Ibuprofen 600mg  every 8 hours as needed for pain and swelling. Follow-up here in 2 days for recheck and possible repacking of wound.      ED Prescriptions   None    PDMP not reviewed this encounter.   , NP 09/06/20 [phone]

## 2020-09-05 NOTE — Discharge Instructions (Addendum)
Continue to take Doxycycline twice a day as directed. Continue to apply warm compresses to area 3 times a day to help with drainage. Leave packing in place but can clean around wound. Continue Ibuprofen 600mg  every 8 hours as needed for pain and swelling. Follow-up here in 2 days for recheck and possible repacking of wound.

## 2020-09-05 NOTE — ED Triage Notes (Signed)
PT reports abscess to right hip/ buttocks. Was seen Monday for same, but was told it was too hard to drain. PT reports it now looks as though it can be drained.

## 2020-09-07 ENCOUNTER — Encounter: Payer: Self-pay | Admitting: Emergency Medicine

## 2020-09-07 ENCOUNTER — Ambulatory Visit
Admission: EM | Admit: 2020-09-07 | Discharge: 2020-09-07 | Disposition: A | Discharge status: Home / Self Care | Payer: Managed Care, Other (non HMO)

## 2020-09-07 ENCOUNTER — Other Ambulatory Visit: Payer: Self-pay

## 2020-09-07 DIAGNOSIS — L0231 Cutaneous abscess of buttock: Secondary | ICD-10-CM

## 2020-09-07 NOTE — ED Triage Notes (Signed)
Patient states that he is here for would check and possible repacking to lanced abscess on his right hip/buttock area.  Patient reports small amount of bleeding.

## 2020-09-07 NOTE — ED Provider Notes (Signed)
[facility]    CSN: [id number] Arrival date & time: 09/07/20  0808      History   Chief Complaint Chief Complaint  Patient presents with   Abscess    Follow-up    HPI Patrick Hogan is a 30 y.o. male.   HPI  30 year old male here for wound check.  Patient was initially evaluated in this urgent care 5 days ago for cellulitis on his right buttock.  At that time there is no abscess formation and he was discharged home on doxycycline with instructions to return for worsening symptoms.  Patient returned on 09/05/2020 because the central part of the lesion had opened up and began to drain.  At that time the erythema was greatly reduced but there continued to be induration.  His wound was packed and he was instructed to come back in 48 hours for recheck.  Today the redness has continued to resolve but there is still a silver dollar area of induration surrounding the large open wound cavity.  The wound bed is pink and moist.  Patient is not had any fever and he is continue to take the doxycycline.  History reviewed. No pertinent past medical history.  There are no problems to display for this patient.   History reviewed. No pertinent surgical history.     Home Medications    Prior to Admission medications   Medication Sig Start Date End Date Taking? Authorizing Provider  doxycycline (VIBRAMYCIN) 100 MG capsule Take 1 capsule (100 mg total) by mouth 2 (two) times daily. 09/02/20  Yes Becky Augusta, NP    Family History History reviewed. No pertinent family history.  Social History Social History   Tobacco Use   Smoking status: Some Days    Pack years: 0.00   Smokeless tobacco: Never  Vaping Use   Vaping Use: Never used  Substance Use Topics   Alcohol use: Yes    Comment: occassional      Allergies   Patient has no known allergies.   Review of Systems Review of Systems  Constitutional:  Negative for fever.  Skin:  Positive for color change and wound.     Physical Exam Triage Vital Signs ED Triage Vitals  Enc Vitals Group     BP 09/07/20 0830 135/74     Pulse Rate 09/07/20 0830 (!) 59     Resp 09/07/20 0830 16     Temp 09/07/20 0830 98.8 F (37.1 C)     Temp Source 09/07/20 0830 Oral     SpO2 09/07/20 0830 100 %     Weight 09/07/20 0829 169 lb 15.6 oz (77.1 kg)     Height 09/07/20 0829 5\' 7"  (1.702 m)     Head Circumference --      Peak Flow --      Pain Score 09/07/20 0828 2     Pain Loc --      Pain Edu? --      Excl. in GC? --    No data found.  Updated Vital Signs BP 135/74 (BP Location: Left Arm)   Pulse (!) 59   Temp 98.8 F (37.1 C) (Oral)   Resp 16   Ht 5\' 7"  (1.702 m)   Wt 169 lb 15.6 oz (77.1 kg)   SpO2 100%   BMI 26.62 kg/m   Visual Acuity Right Eye Distance:   Left Eye Distance:   Bilateral Distance:    Right Eye Near:   Left Eye Near:  Bilateral Near:     Physical Exam Vitals and nursing note reviewed.  Constitutional:      Appearance: Normal appearance.  Skin:    General: Skin is warm and dry.     Capillary Refill: Capillary refill takes less than 2 seconds.     Findings: Lesion present. No erythema.  Neurological:     General: No focal deficit present.     Mental Status: He is alert and oriented to person, place, and time.  Psychiatric:        Mood and Affect: Mood normal.        Behavior: Behavior normal.        Thought Content: Thought content normal.        Judgment: Judgment normal.     UC Treatments / Results  Labs (all labs ordered are listed, but only abnormal results are displayed) Labs Reviewed - No data to display  EKG   Radiology No results found.  Procedures Procedures (including critical care time)  Medications Ordered in UC Medications - No data to display  Initial Impression / Assessment and Plan / UC Course  I have reviewed the triage vital signs and the nursing notes.  Pertinent labs & imaging results that were available during my care of the  patient were reviewed by me and considered in my medical decision making (see chart for details).  Patient is a nontoxic-appearing 30 year old male here for a wound check as outlined in HPI above.  Patient is a dressing in place and packing remains in the wound.  Upon removing the dressing there is some dried serous drainage surrounding the open area of drainage.  The wound was cleansed with wound spray and the packing removed.  The wound bed is pink and moist and there continues to be approximately a silver dollar area of induration around the open drainage port.  The erythema has nearly completely resolved.  There is no pus that was able to be expressed from the wound with palpation of the surrounding indurated tissue.  The wound was repacked with quarter inch packing and a new dressing of 4 x 4's and tape was applied.  Patient struck to return in 48 hours for packing removal and subsequent wound check.   Final Clinical Impressions(s) / UC Diagnoses   Final diagnoses:  Abscess of buttock, right     Discharge Instructions      Continue take the doxycycline as previously prescribed.  Change the dressing that was applied daily but make sure that the packing stays in.  Return in 48 hours for a wound check, sooner if you develop increased redness, pain, or fever.     ED Prescriptions   None    PDMP not reviewed this encounter.   Becky Augusta, NP 09/07/20 [phone]

## 2020-09-07 NOTE — Discharge Instructions (Addendum)
Continue take the doxycycline as previously prescribed.  Change the dressing that was applied daily but make sure that the packing stays in.  Return in 48 hours for a wound check, sooner if you develop increased redness, pain, or fever.

## 2020-09-09 ENCOUNTER — Ambulatory Visit
Admission: EM | Admit: 2020-09-09 | Discharge: 2020-09-09 | Disposition: A | Discharge status: Home / Self Care | Payer: Managed Care, Other (non HMO) | Attending: Emergency Medicine | Admitting: Emergency Medicine

## 2020-09-09 ENCOUNTER — Other Ambulatory Visit: Payer: Self-pay

## 2020-09-09 DIAGNOSIS — Z5189 Encounter for other specified aftercare: Secondary | ICD-10-CM

## 2020-09-09 DIAGNOSIS — L0231 Cutaneous abscess of buttock: Secondary | ICD-10-CM | POA: Diagnosis not present

## 2020-09-09 NOTE — ED Triage Notes (Signed)
Pt returns for f/u and assessment of abscess to his right buttock. Pt had I&D and packing placed 09/07/20. Pt reports area seems to be improving, there is only slight pain. Denies f/n/v/d or other symptoms of possible infection. Pt is still taking abx.

## 2020-09-09 NOTE — ED Provider Notes (Signed)
[facility]    CSN: [id number] Arrival date & time: 09/09/20  0813      History   Chief Complaint Chief Complaint  Patient presents with   Abscess    HPI Patrick Hogan is a 30 y.o. male.   HPI  30 year old male here for wound check..  Patient has been returning for serial exams following a spontaneous rupture of an abscess on the right buttock.  He reports that he has had some serous drainage from the wound but is not had any fever and the redness has decreased.  He has finished up his doxycycline.  History reviewed. No pertinent past medical history.  There are no problems to display for this patient.   History reviewed. No pertinent surgical history.     Home Medications    Prior to Admission medications   Medication Sig Start Date End Date Taking? Authorizing Provider  doxycycline (VIBRAMYCIN) 100 MG capsule Take 1 capsule (100 mg total) by mouth 2 (two) times daily. 09/02/20  Yes Becky Augusta, NP    Family History History reviewed. No pertinent family history.  Social History Social History   Tobacco Use   Smoking status: Some Days    Pack years: 0.00   Smokeless tobacco: Never  Vaping Use   Vaping Use: Never used  Substance Use Topics   Alcohol use: Yes    Comment: occassional      Allergies   Patient has no known allergies.   Review of Systems Review of Systems  Constitutional:  Negative for fever.  Skin:  Positive for wound. Negative for color change.    Physical Exam Triage Vital Signs ED Triage Vitals  Enc Vitals Group     BP 09/09/20 0823 138/83     Pulse Rate 09/09/20 0823 (!) 56     Resp 09/09/20 0823 18     Temp 09/09/20 0823 98.3 F (36.8 C)     Temp Source 09/09/20 0823 Oral     SpO2 09/09/20 0823 100 %     Weight 09/09/20 0823 168 lb (76.2 kg)     Height 09/09/20 0823 5\' 7"  (1.702 m)     Head Circumference --      Peak Flow --      Pain Score 09/09/20 0822 4     Pain Loc --      Pain Edu? --      Excl. in  GC? --    No data found.  Updated Vital Signs BP 138/83 (BP Location: Left Arm)   Pulse (!) 56   Temp 98.3 F (36.8 C) (Oral)   Resp 18   Ht 5\' 7"  (1.702 m)   Wt 168 lb (76.2 kg)   SpO2 100%   BMI 26.31 kg/m   Visual Acuity Right Eye Distance:   Left Eye Distance:   Bilateral Distance:    Right Eye Near:   Left Eye Near:    Bilateral Near:     Physical Exam Vitals and nursing note reviewed.  Constitutional:      General: He is not in acute distress.    Appearance: Normal appearance. He is normal weight. He is not ill-appearing.  HENT:     Head: Normocephalic and atraumatic.  Skin:    General: Skin is warm and dry.     Capillary Refill: Capillary refill takes less than 2 seconds.     Findings: No erythema or lesion.  Neurological:     General: No focal deficit present.  Mental Status: He is alert and oriented to person, place, and time.  Psychiatric:        Mood and Affect: Mood normal.        Behavior: Behavior normal.        Thought Content: Thought content normal.     UC Treatments / Results  Labs (all labs ordered are listed, but only abnormal results are displayed) Labs Reviewed - No data to display  EKG   Radiology No results found.  Procedures Procedures (including critical care time)  Medications Ordered in UC Medications - No data to display  Initial Impression / Assessment and Plan / UC Course  I have reviewed the triage vital signs and the nursing notes.  Pertinent labs & imaging results that were available during my care of the patient were reviewed by me and considered in my medical decision making (see chart for details).  Patient is a very pleasant and nontoxic-appearing 30 year old male here for evaluation of his right buttock abscess and wound check.  Patient had a dressing in place but when the dressing was removed the packing remained intact.  There was some serous drainage on the packing but nothing in the wound bed.  The wound  bed is pink and moist.  The surrounding erythema has nearly completely resolved and there is a small 3 cm x 4 cm area of induration.  When palpated there is no pus expressed from the wound.  Wound was repacked with 29 cm of quarter inch packing and a nonadherent gauze dressing was placed on top and secured with paper tape.  Patient has finished his antibiotic therapy and the wound is healing well.  We will have patient return in 48 hours for packing removal and subsequent wound check.  I suspect that at that time the induration should have resolved and the wound will not need to be packed any further.   Final Clinical Impressions(s) / UC Diagnoses   Final diagnoses:  Abscess of buttock, right  Wound check, abscess     Discharge Instructions      Return in 48 hours for wound check.  I believe that time we will be able to remove the packing and let the wound heal.  Return sooner if you have return of redness, swelling, increased drainage from the wound, or fever.     ED Prescriptions   None    PDMP not reviewed this encounter.   Becky Augusta, NP 09/09/20 [phone]

## 2020-09-09 NOTE — Discharge Instructions (Addendum)
Return in 48 hours for wound check.  I believe that time we will be able to remove the packing and let the wound heal.  Return sooner if you have return of redness, swelling, increased drainage from the wound, or fever.

## 2020-09-11 ENCOUNTER — Ambulatory Visit
Admission: EM | Admit: 2020-09-11 | Discharge: 2020-09-11 | Disposition: A | Discharge status: Home / Self Care | Payer: Managed Care, Other (non HMO) | Attending: Family Medicine | Admitting: Family Medicine

## 2020-09-11 ENCOUNTER — Encounter: Payer: Self-pay | Admitting: Emergency Medicine

## 2020-09-11 ENCOUNTER — Other Ambulatory Visit: Payer: Self-pay

## 2020-09-11 DIAGNOSIS — L0231 Cutaneous abscess of buttock: Secondary | ICD-10-CM

## 2020-09-11 NOTE — ED Provider Notes (Signed)
[facility]    CSN: [id number] Arrival date & time: 09/11/20  0804      History   Chief Complaint Chief Complaint  Patient presents with   Abscess   HPI 30 year old male presents for reevaluation and packing removal.  Patient has recently been seen here for abscess.  Has had successful incision and drainage.  Has had to be packed a few times.  He is here today for reassessment and packing removal.  He is much improved.  He states that he has noticed that the discharge from the wound is minimal.  Denies fever.  No other complaints.   Home Medications    Prior to Admission medications   Medication Sig Start Date End Date Taking? Authorizing Provider  doxycycline (VIBRAMYCIN) 100 MG capsule Take 1 capsule (100 mg total) by mouth 2 (two) times daily. 09/02/20  Yes Becky Augusta, NP   Social History Social History   Tobacco Use   Smoking status: Some Days    Pack years: 0.00   Smokeless tobacco: Never  Vaping Use   Vaping Use: Never used  Substance Use Topics   Alcohol use: Yes    Comment: occassional    Drug use: Not Currently     Allergies   Patient has no known allergies.   Review of Systems Review of Systems  Constitutional: Negative.   Skin:  Positive for wound.   Physical Exam Triage Vital Signs ED Triage Vitals  Enc Vitals Group     BP 09/11/20 0820 139/83     Pulse Rate 09/11/20 0820 (!) 55     Resp 09/11/20 0820 18     Temp 09/11/20 0820 98.4 F (36.9 C)     Temp Source 09/11/20 0820 Oral     SpO2 09/11/20 0820 100 %     Weight 09/11/20 0818 167 lb 15.9 oz (76.2 kg)     Height 09/11/20 0818 5\' 7"  (1.702 m)     Head Circumference --      Peak Flow --      Pain Score 09/11/20 0817 4     Pain Loc --      Pain Edu? --      Excl. in GC? --    Updated Vital Signs BP 139/83 (BP Location: Right Arm)   Pulse (!) 55   Temp 98.4 F (36.9 C) (Oral)   Resp 18   Ht 5\' 7"  (1.702 m)   Wt 76.2 kg   SpO2 100%   BMI 26.31 kg/m   Visual  Acuity Right Eye Distance:   Left Eye Distance:   Bilateral Distance:    Right Eye Near:   Left Eye Near:    Bilateral Near:     Physical Exam Vitals and nursing note reviewed.  Constitutional:      General: He is not in acute distress.    Appearance: Normal appearance. He is not ill-appearing.  HENT:     Head: Normocephalic and atraumatic.  Pulmonary:     Effort: Pulmonary effort is normal. No respiratory distress.  Skin:    Comments: Right buttock -slight induration noted.  Central wound with packing.  Neurological:     Mental Status: He is alert.  Psychiatric:        Mood and Affect: Mood normal.        Behavior: Behavior normal.     UC Treatments / Results  Labs (all labs ordered are listed, but only abnormal results are displayed) Labs Reviewed - No  data to display  EKG   Radiology No results found.  Procedures Procedures (including critical care time)  Medications Ordered in UC Medications - No data to display  Initial Impression / Assessment and Plan / UC Course  I have reviewed the triage vital signs and the nursing notes.  Pertinent labs & imaging results that were available during my care of the patient were reviewed by me and considered in my medical decision making (see chart for details).    30 year old male presents for wound check.  Packing removed.  No indications for additional packing.  Finish antibiotic course.  Supportive care.  Daily dressing change.  Final Clinical Impressions(s) / UC Diagnoses   Final diagnoses:  None   Discharge Instructions   None    ED Prescriptions   None    PDMP not reviewed this encounter.   Everlene Other [place], [place] 09/11/20 [phone]

## 2020-09-11 NOTE — ED Triage Notes (Signed)
Pt returns for a f/u of an abscess on his right buttock. He states he was here on 09/09/20 and had abscess re packed. He states it is feeling better.
